# Patient Record
Sex: Male | Born: 1950 | Race: White | Hispanic: No | Marital: Married | Smoking: Former smoker
Health system: Southern US, Community
[De-identification: ages and names within clinical notes are randomized; demographics above are authoritative.]

---

## 2006-11-07 ENCOUNTER — Ambulatory Visit: Payer: Self-pay | Admitting: Gastroenterology

## 2017-09-18 DIAGNOSIS — L570 Actinic keratosis: Secondary | ICD-10-CM

## 2017-09-18 HISTORY — DX: Actinic keratosis: L57.0

## 2020-01-30 ENCOUNTER — Ambulatory Visit: Payer: Medicare PPO | Attending: Internal Medicine

## 2020-01-30 DIAGNOSIS — Z23 Encounter for immunization: Secondary | ICD-10-CM | POA: Insufficient documentation

## 2020-01-30 NOTE — Progress Notes (Signed)
   Covid-19 Vaccination Clinic  Name:  Mike Ellis    MRN: [id number] DOB: [birth year]  01/30/2020  [name] was observed post Covid-19 immunization for 15 minutes without incidence. He was provided with Vaccine Information Sheet and instruction to access the V-Safe system.   [name] was instructed to call 911 with any severe reactions post vaccine: Marland Kitchen Difficulty breathing  . Swelling of your face and throat  . A fast heartbeat  . A bad rash all over your body  . Dizziness and weakness    Immunizations Administered    Name Date Dose VIS Date Route   Pfizer COVID-19 Vaccine 01/30/2020  9:36 AM 0.3 mL 11/15/2019 Intramuscular   Manufacturer: [place]   Lot: [id number]   [place]: [id number]

## 2020-02-25 ENCOUNTER — Ambulatory Visit: Payer: Medicare PPO | Attending: Internal Medicine

## 2020-02-25 DIAGNOSIS — Z23 Encounter for immunization: Secondary | ICD-10-CM

## 2020-02-25 NOTE — Progress Notes (Signed)
   Covid-19 Vaccination Clinic  Name:  Mike Ellis    MRN: [id number] DOB: 21-Aug-1951  02/25/2020  Mr. Snapp was observed post Covid-19 immunization for 15 minutes without incident. He was provided with Vaccine Information Sheet and instruction to access the V-Safe system.   Mr. Polito was instructed to call 911 with any severe reactions post vaccine: Marland Kitchen Difficulty breathing  . Swelling of face and throat  . A fast heartbeat  . A bad rash all over body  . Dizziness and weakness   Immunizations Administered    Name Date Dose VIS Date Route   Pfizer COVID-19 Vaccine 02/25/2020  9:23 AM 0.3 mL 11/15/2019 Intramuscular   Manufacturer: [place]   Lot: [id number]   [place]: [id number]

## 2020-05-19 ENCOUNTER — Telehealth: Payer: Self-pay | Admitting: *Deleted

## 2020-05-19 NOTE — Telephone Encounter (Signed)
Attempted to contact for scheduling lung screening scan. Wife will have patient call back.

## 2020-05-20 ENCOUNTER — Telehealth: Payer: Self-pay | Admitting: *Deleted

## 2020-05-20 DIAGNOSIS — Z87891 Personal history of nicotine dependence: Secondary | ICD-10-CM

## 2020-05-20 NOTE — Telephone Encounter (Signed)
Received referral for initial lung cancer screening scan. Contacted patient and obtained smoking history,(former, quit 14 years ago, 37 pack year) as well as answering questions related to screening process. Patient denies signs of lung cancer such as weight loss or hemoptysis. Patient denies comorbidity that would prevent curative treatment if lung cancer were found. Patient is scheduled for CT scan on 05/29/20 at 830am.

## 2020-05-27 ENCOUNTER — Inpatient Hospital Stay: Payer: Medicare PPO | Attending: Oncology | Admitting: Oncology

## 2020-05-27 DIAGNOSIS — Z87891 Personal history of nicotine dependence: Secondary | ICD-10-CM | POA: Diagnosis not present

## 2020-05-27 NOTE — Progress Notes (Signed)
Virtual Visit via Video Note  I connected with [name] on 05/27/20 at  8:30 AM EDT by a video enabled telemedicine application and verified that I am speaking with the correct person using two identifiers.  Location: Patient: Home Provider: Clinic   I discussed the limitations of evaluation and management by telemedicine and the availability of in person appointments. The patient expressed understanding and agreed to proceed.  I discussed the assessment and treatment plan with the patient. The patient was provided an opportunity to ask questions and all were answered. The patient agreed with the plan and demonstrated an understanding of the instructions.   The patient was advised to call back or seek an in-person evaluation if the symptoms worsen or if the condition fails to improve as anticipated.   In accordance with CMS guidelines, patient has met eligibility criteria including age, absence of signs or symptoms of lung cancer.  Social History   Tobacco Use  . Smoking status: Not on file  Substance Use Topics  . Alcohol use: Not on file  . Drug use: Not on file      A shared decision-making session was conducted prior to the performance of CT scan. This includes one or more decision aids, includes benefits and harms of screening, follow-up diagnostic testing, over-diagnosis, false positive rate, and total radiation exposure.   Counseling on the importance of adherence to annual lung cancer LDCT screening, impact of co-morbidities, and ability or willingness to undergo diagnosis and treatment is imperative for compliance of the program.   Counseling on the importance of continued smoking cessation for former smokers; the importance of smoking cessation for current smokers, and information about tobacco cessation interventions have been given to patient including [place] and 1800 quit [place] programs.   Written order for lung cancer screening with LDCT has been given to  the patient and any and all questions have been answered to the best of my abilities.    Yearly follow up will be coordinated by Burgess Estelle, Thoracic Navigator.  I provided 15 minutes of face-to-face video visit time during this encounter, and > 50% was spent counseling as documented under my assessment & plan.   Jacquelin Hawking, NP

## 2020-05-29 ENCOUNTER — Other Ambulatory Visit: Payer: Self-pay

## 2020-05-29 ENCOUNTER — Ambulatory Visit
Admission: RE | Admit: 2020-05-29 | Discharge: 2020-05-29 | Disposition: A | Discharge status: Home / Self Care | Payer: Medicare PPO | Source: Ambulatory Visit | Attending: Oncology | Admitting: Oncology

## 2020-05-29 DIAGNOSIS — Z87891 Personal history of nicotine dependence: Secondary | ICD-10-CM | POA: Diagnosis not present

## 2020-06-01 ENCOUNTER — Telehealth: Payer: Self-pay | Admitting: *Deleted

## 2020-06-01 NOTE — Telephone Encounter (Signed)
°  Notified patient of LDCT lung cancer screening program results with recommendation for 12 month follow up imaging. Also notified of incidental findings noted below and is encouraged to discuss further with PCP who will receive a copy of this note and/or the CT report. Patient verbalizes understanding.   IMPRESSION: 1. Lung-RADS 2, benign appearance or behavior. Continue annual screening with low-dose chest CT without contrast in 12 months. 2. Emphysema (ICD10-J43.9). 3. Coronary artery atherosclerosis.

## 2020-10-16 ENCOUNTER — Other Ambulatory Visit: Payer: Self-pay | Admitting: Internal Medicine

## 2020-10-16 ENCOUNTER — Ambulatory Visit: Payer: Medicare PPO | Attending: Internal Medicine

## 2020-10-16 DIAGNOSIS — Z23 Encounter for immunization: Secondary | ICD-10-CM

## 2020-10-16 NOTE — Progress Notes (Signed)
   Covid-19 Vaccination Clinic  Name:  Mike Ellis    MRN: [id number] DOB: 1951/05/07  10/16/2020  [name] was observed post Covid-19 immunization for 15 minutes without incident. He was provided with Vaccine Information Sheet and instruction to access the V-Safe system.   [name] was instructed to call 911 with any severe reactions post vaccine: Marland Kitchen Difficulty breathing  . Swelling of face and throat  . A fast heartbeat  . A bad rash all over body  . Dizziness and weakness

## 2021-01-04 ENCOUNTER — Encounter: Payer: Medicare PPO | Admitting: Dermatology

## 2021-02-18 ENCOUNTER — Ambulatory Visit: Payer: Medicare PPO | Admitting: Dermatology

## 2021-05-21 ENCOUNTER — Ambulatory Visit: Payer: Medicare PPO | Attending: Internal Medicine

## 2021-05-21 ENCOUNTER — Other Ambulatory Visit: Payer: Self-pay

## 2021-05-21 DIAGNOSIS — Z23 Encounter for immunization: Secondary | ICD-10-CM

## 2021-05-21 MED ORDER — PFIZER-BIONT COVID-19 VAC-TRIS 30 MCG/0.3ML IM SUSP
INTRAMUSCULAR | 0 refills | Status: AC
  Filled 2021-05-21: qty 0.3, 1d supply, fill #0

## 2021-05-21 NOTE — Progress Notes (Signed)
   Covid-19 Vaccination Clinic  Name:  Mike Ellis    MRN: [id number] DOB: December 15, 1950  05/21/2021  Mike Ellis was observed post Covid-19 immunization for 15 minutes without incident. He was provided with Vaccine Information Sheet and instruction to access the V-Safe system.   Mike Ellis was instructed to call 911 with any severe reactions post vaccine: Difficulty breathing  Swelling of face and throat  A fast heartbeat  A bad rash all over body  Dizziness and weakness   Immunizations Administered     Name Date Dose VIS Date Route   PFIZER Comrnaty([place] TOP) Covid-19 Vaccine 05/21/2021  9:39 AM 0.3 mL 11/12/2020 Intramuscular   Manufacturer: [place]   Lot: [id number]   [place]: [address]-2      Lu Duffel, PharmD, MBA Clinical Acute Care Pharmacist

## 2021-06-02 ENCOUNTER — Ambulatory Visit: Payer: Medicare PPO | Admitting: Dermatology

## 2021-06-02 ENCOUNTER — Other Ambulatory Visit: Payer: Self-pay

## 2021-06-02 DIAGNOSIS — L578 Other skin changes due to chronic exposure to nonionizing radiation: Secondary | ICD-10-CM

## 2021-06-02 DIAGNOSIS — L570 Actinic keratosis: Secondary | ICD-10-CM | POA: Diagnosis not present

## 2021-06-02 DIAGNOSIS — Z1283 Encounter for screening for malignant neoplasm of skin: Secondary | ICD-10-CM

## 2021-06-02 DIAGNOSIS — D1800 Hemangioma unspecified site: Secondary | ICD-10-CM

## 2021-06-02 DIAGNOSIS — L820 Inflamed seborrheic keratosis: Secondary | ICD-10-CM | POA: Diagnosis not present

## 2021-06-02 DIAGNOSIS — L814 Other melanin hyperpigmentation: Secondary | ICD-10-CM | POA: Diagnosis not present

## 2021-06-02 DIAGNOSIS — L821 Other seborrheic keratosis: Secondary | ICD-10-CM | POA: Diagnosis not present

## 2021-06-02 DIAGNOSIS — C44622 Squamous cell carcinoma of skin of right upper limb, including shoulder: Secondary | ICD-10-CM | POA: Diagnosis not present

## 2021-06-02 DIAGNOSIS — C4492 Squamous cell carcinoma of skin, unspecified: Secondary | ICD-10-CM

## 2021-06-02 DIAGNOSIS — D492 Neoplasm of unspecified behavior of bone, soft tissue, and skin: Secondary | ICD-10-CM

## 2021-06-02 DIAGNOSIS — D229 Melanocytic nevi, unspecified: Secondary | ICD-10-CM

## 2021-06-02 HISTORY — DX: Squamous cell carcinoma of skin, unspecified: C44.92

## 2021-06-02 NOTE — Progress Notes (Signed)
Follow-Up Visit   Subjective  Mike Ellis is a 70 y.o. male who presents for the following: Annual Exam. Mole check, hx of Aks  The patient presents for Total-Body Skin Exam (TBSE) for skin cancer screening and mole check.   The following portions of the chart were reviewed this encounter and updated as appropriate:   Allergies  Meds  Problems  Med Hx  Surg Hx  Fam Hx      Review of Systems:  No other skin or systemic complaints except as noted in HPI or Assessment and Plan.  Objective  Well appearing patient in no apparent distress; mood and affect are within normal limits.  A full examination was performed including scalp, head, eyes, ears, nose, lips, neck, chest, axillae, abdomen, back, buttocks, bilateral upper extremities, bilateral lower extremities, hands, feet, fingers, toes, fingernails, and toenails. All findings within normal limits unless otherwise noted below.  right mid lateral forearm 0.8 cm keratotic papule   face, ears x 9 (9) Erythematous thin papules/macules with gritty scale.   trunk, arms, right knee, left popliteal  (23) (23) Erythematous keratotic or waxy stuck-on papule or plaque.    Assessment & Plan  Neoplasm of skin right mid lateral forearm  Epidermal / dermal shaving  Lesion diameter (cm):  0.8 Informed consent: discussed and consent obtained   Patient was prepped and draped in usual sterile fashion: area prepped with alcohol. Anesthesia: the lesion was anesthetized in a standard fashion   Anesthetic:  1% lidocaine w/ epinephrine 1-100,000 buffered w/ 8.4% NaHCO3 Instrument used: flexible razor blade   Hemostasis achieved with: pressure, aluminum chloride and electrodesiccation   Outcome: patient tolerated procedure well   Post-procedure details: wound care instructions given   Post-procedure details comment:  Ointment and small bandage applied  Destruction of lesion  Destruction method: electrodesiccation and curettage    Informed consent: discussed and consent obtained   Timeout:  patient name, date of birth, surgical site, and procedure verified Anesthesia: the lesion was anesthetized in a standard fashion   Anesthetic:  1% lidocaine w/ epinephrine 1-100,000 buffered w/ 8.4% NaHCO3 Curettage performed in three different directions: Yes   Electrodesiccation performed over the curetted area: Yes   Curettage cycles:  3 Lesion length (cm):  0.8 Lesion width (cm):  0.8 Margin per side (cm):  0.2 Final wound size (cm):  1.2 Hemostasis achieved with:  electrodesiccation Outcome: patient tolerated procedure well with no complications   Post-procedure details: sterile dressing applied and wound care instructions given   Dressing type: petrolatum    Specimen 1 - Surgical pathology Differential Diagnosis: R/O SCC  Check Margins: No  AK (actinic keratosis) (9) face, ears x 9  Actinic keratoses are precancerous spots that appear secondary to cumulative UV radiation exposure/sun exposure over time. They are chronic with expected duration over 1 year. A portion of actinic keratoses will progress to squamous cell carcinoma of the skin. It is not possible to reliably predict which spots will progress to skin cancer and so treatment is recommended to prevent development of skin cancer.  Recommend daily broad spectrum sunscreen SPF 30+ to sun-exposed areas, reapply every 2 hours as needed.  Recommend staying in the shade or wearing long sleeves, sun glasses (UVA+UVB protection) and wide brim hats (4-inch brim around the entire circumference of the hat). Call for new or changing lesions.   Destruction of lesion - face, ears x 9 Complexity: simple   Destruction method: cryotherapy   Informed consent: discussed and consent obtained  Timeout:  patient name, date of birth, surgical site, and procedure verified Lesion destroyed using liquid nitrogen: Yes   Region frozen until ice ball extended beyond lesion: Yes    Outcome: patient tolerated procedure well with no complications   Post-procedure details: wound care instructions given    Inflamed seborrheic keratosis trunk, arms, right knee, left popliteal  (23)  Destruction of lesion - trunk, arms, right knee, left popliteal  (23) Complexity: simple   Destruction method: cryotherapy   Informed consent: discussed and consent obtained   Timeout:  patient name, date of birth, surgical site, and procedure verified Lesion destroyed using liquid nitrogen: Yes   Region frozen until ice ball extended beyond lesion: Yes   Outcome: patient tolerated procedure well with no complications   Post-procedure details: wound care instructions given    Skin cancer screening  Lentigines - Scattered tan macules - Due to sun exposure - Benign-appering, observe - Recommend daily broad spectrum sunscreen SPF 30+ to sun-exposed areas, reapply every 2 hours as needed. - Call for any changes  Seborrheic Keratoses - Stuck-on, waxy, tan-brown papules and/or plaques  - Benign-appearing - Discussed benign etiology and prognosis. - Observe - Call for any changes  Melanocytic Nevi - Tan-brown and/or pink-flesh-colored symmetric macules and papules - Benign appearing on exam today - Observation - Call clinic for new or changing moles - Recommend daily use of broad spectrum spf 30+ sunscreen to sun-exposed areas.   Hemangiomas - Red papules - Discussed benign nature - Observe - Call for any changes  Actinic Damage - Chronic condition, secondary to cumulative UV/sun exposure - diffuse scaly erythematous macules with underlying dyspigmentation - Recommend daily broad spectrum sunscreen SPF 30+ to sun-exposed areas, reapply every 2 hours as needed.  - Staying in the shade or wearing long sleeves, sun glasses (UVA+UVB protection) and wide brim hats (4-inch brim around the entire circumference of the hat) are also recommended for sun protection.  - Call for new or  changing lesions.  Skin cancer screening performed today.   Return in about 1 year (around 06/02/2022) for TBSE.  IMarye Round, CMA, am acting as scribe for Sarina Ser, MD .  Documentation: I have reviewed the above documentation for accuracy and completeness, and I agree with the above.  Sarina Ser, MD

## 2021-06-02 NOTE — Patient Instructions (Signed)
Cryotherapy Aftercare  Wash gently with soap and water everyday.   Apply Vaseline and Band-Aid daily until healed.    Wound Care Instructions  Cleanse wound gently with soap and water once a day then pat dry with clean gauze. Apply a thing coat of Petrolatum (petroleum jelly, "Vaseline") over the wound (unless you have an allergy to this). We recommend that you use a new, sterile tube of Vaseline. Do not pick or remove scabs. Do not remove the yellow or white "healing tissue" from the base of the wound.  Cover the wound with fresh, clean, nonstick gauze and secure with paper tape. You may use Band-Aids in place of gauze and tape if the would is small enough, but would recommend trimming much of the tape off as there is often too much. Sometimes Band-Aids can irritate the skin.  You should call the office for your biopsy report after 1 week if you have not already been contacted.  If you experience any problems, such as abnormal amounts of bleeding, swelling, significant bruising, significant pain, or evidence of infection, please call the office immediately.  FOR ADULT SURGERY PATIENTS: If you need something for pain relief you may take 1 extra strength Tylenol (acetaminophen) AND 2 Ibuprofen (200mg each) together every 4 hours as needed for pain. (do not take these if you are allergic to them or if you have a reason you should not take them.) Typically, you may only need pain medication for 1 to 3 days.     If you have any questions or concerns for your doctor, please call our main line at [phone] and press option 4 to reach your doctor's medical assistant. If no one answers, please leave a voicemail as directed and we will return your call as soon as possible. Messages left after 4 pm will be answered the following business day.   You may also send us a message via MyChart. We typically respond to MyChart messages within 1-2 business days.  For prescription refills, please ask your  pharmacy to contact our office. Our fax number is [phone].  If you have an urgent issue when the clinic is closed that cannot wait until the next business day, you can page your doctor at the number below.    Please note that while we do our best to be available for urgent issues outside of office hours, we are not available 24/7.   If you have an urgent issue and are unable to reach us, you may choose to seek medical care at your doctor's office, retail clinic, urgent care center, or emergency room.  If you have a medical emergency, please immediately call 911 or go to the emergency department.  Pager Numbers  - Dr. Kowalski: [phone]  - Dr. Moye: [phone]  - Dr. Stewart: [phone]  In the event of inclement weather, please call our main line at [phone] for an update on the status of any delays or closures.  Dermatology Medication Tips: Please keep the boxes that topical medications come in in order to help keep track of the instructions about where and how to use these. Pharmacies typically print the medication instructions only on the boxes and not directly on the medication tubes.   If your medication is too expensive, please contact our office at [phone] option 4 or send us a message through MyChart.   We are unable to tell what your co-pay for medications will be in advance as this is different depending on your insurance coverage.   However, we may be able to find a substitute medication at lower cost or fill out paperwork to get insurance to cover a needed medication.   If a prior authorization is required to get your medication covered by your insurance company, please allow us 1-2 business days to complete this process.  Drug prices often vary depending on where the prescription is filled and some pharmacies may offer cheaper prices.  The website www.goodrx.com contains coupons for medications through different pharmacies. The prices here do not  account for what the cost may be with help from insurance (it may be cheaper with your insurance), but the website can give you the price if you did not use any insurance.  - You can print the associated coupon and take it with your prescription to the pharmacy.  - You may also stop by our office during regular business hours and pick up a GoodRx coupon card.  - If you need your prescription sent electronically to a different pharmacy, notify our office through [place] MyChart or by phone at [phone] option 4.  

## 2021-06-08 ENCOUNTER — Other Ambulatory Visit: Payer: Self-pay

## 2021-06-10 ENCOUNTER — Telehealth: Payer: Self-pay

## 2021-06-10 NOTE — Telephone Encounter (Signed)
-----   Message from Brendolyn Patty, MD sent at 06/09/2021  8:52 PM EDT ----- Skin , right mid lateral forearm WELL DIFFERENTIATED SQUAMOUS CELL CARCINOMA WITH SUPERFICIAL INFILTRATION, BASE INVOLVED  SCC skin cancer- already treated with EDC at time of biopsy  - please call pt

## 2021-06-10 NOTE — Telephone Encounter (Signed)
Advised patient of results/hd  

## 2021-06-12 ENCOUNTER — Encounter: Payer: Self-pay | Admitting: Dermatology

## 2021-06-14 ENCOUNTER — Ambulatory Visit: Payer: Medicare PPO | Admitting: Dermatology

## 2021-12-09 ENCOUNTER — Ambulatory Visit: Payer: Medicare PPO | Admitting: Dermatology

## 2021-12-09 ENCOUNTER — Other Ambulatory Visit: Payer: Self-pay

## 2021-12-09 DIAGNOSIS — L578 Other skin changes due to chronic exposure to nonionizing radiation: Secondary | ICD-10-CM | POA: Diagnosis not present

## 2021-12-09 DIAGNOSIS — L821 Other seborrheic keratosis: Secondary | ICD-10-CM

## 2021-12-09 DIAGNOSIS — L820 Inflamed seborrheic keratosis: Secondary | ICD-10-CM

## 2021-12-09 NOTE — Patient Instructions (Addendum)
Seborrheic Keratosis ? ?What causes seborrheic keratoses? ?Seborrheic keratoses are harmless, common skin growths that first appear during adult life.  As time goes by, more growths appear.  Some people may develop a large number of them.  Seborrheic keratoses appear on both covered and uncovered body parts.  They are not caused by sunlight.  The tendency to develop seborrheic keratoses can be inherited.  They vary in color from skin-colored to gray, brown, or even black.  They can be either smooth or have a rough, warty surface.   ?Seborrheic keratoses are superficial and look as if they were stuck on the skin.  Under the microscope this type of keratosis looks like layers upon layers of skin.  That is why at times the top layer may seem to fall off, but the rest of the growth remains and re-grows.   ? ?Treatment ?Seborrheic keratoses do not need to be treated, but can easily be removed in the office.  Seborrheic keratoses often cause symptoms when they rub on clothing or jewelry.  Lesions can be in the way of shaving.  If they become inflamed, they can cause itching, soreness, or burning.  Removal of a seborrheic keratosis can be accomplished by freezing, burning, or surgery. ?If any spot bleeds, scabs, or grows rapidly, please return to have it checked, as these can be an indication of a skin cancer. ? ? ?Cryotherapy Aftercare ? ?Wash gently with soap and water everyday.   ?Apply Vaseline and Band-Aid daily until healed.  ? ? ?If You Need Anything After Your Visit ? ?If you have any questions or concerns for your doctor, please call our main line at [phone] and press option 4 to reach your doctor's medical assistant. If no one answers, please leave a voicemail as directed and we will return your call as soon as possible. Messages left after 4 pm will be answered the following business day.  ? ?You may also send us a message via MyChart. We typically respond to MyChart messages within 1-2 business  days. ? ?For prescription refills, please ask your pharmacy to contact our office. Our fax number is [phone]. ? ?If you have an urgent issue when the clinic is closed that cannot wait until the next business day, you can page your doctor at the number below.   ? ?Please note that while we do our best to be available for urgent issues outside of office hours, we are not available 24/7.  ? ?If you have an urgent issue and are unable to reach us, you may choose to seek medical care at your doctor's office, retail clinic, urgent care center, or emergency room. ? ?If you have a medical emergency, please immediately call 911 or go to the emergency department. ? ?Pager Numbers ? ?- Dr. Kowalski: [phone] ? ?- Dr. Moye: [phone] ? ?- Dr. Stewart: [phone] ? ?In the event of inclement weather, please call our main line at [phone] for an update on the status of any delays or closures. ? ?Dermatology Medication Tips: ?Please keep the boxes that topical medications come in in order to help keep track of the instructions about where and how to use these. Pharmacies typically print the medication instructions only on the boxes and not directly on the medication tubes.  ? ?If your medication is too expensive, please contact our office at [phone] option 4 or send us a message through MyChart.  ? ?We are unable to tell what your co-pay for medications will be in advance   as this is different depending on your insurance coverage. However, we may be able to find a substitute medication at lower cost or fill out paperwork to get insurance to cover a needed medication.  ? ?If a prior authorization is required to get your medication covered by your insurance company, please allow us 1-2 business days to complete this process. ? ?Drug prices often vary depending on where the prescription is filled and some pharmacies may offer cheaper prices. ? ?The website www.goodrx.com contains coupons for medications through  different pharmacies. The prices here do not account for what the cost may be with help from insurance (it may be cheaper with your insurance), but the website can give you the price if you did not use any insurance.  ?- You can print the associated coupon and take it with your prescription to the pharmacy.  ?- You may also stop by our office during regular business hours and pick up a GoodRx coupon card.  ?- If you need your prescription sent electronically to a different pharmacy, notify our office through [facility] MyChart or by phone at [phone] option 4. ? ? ? ? ?Si Usted Necesita Algo Despu?s de Su Visita ? ?Tambi?n puede enviarnos un mensaje a trav?s de MyChart. Por lo general respondemos a los mensajes de MyChart en el transcurso de 1 a 2 d?as h?biles. ? ?Para renovar recetas, por favor pida a su farmacia que se ponga en contacto con nuestra oficina. Nuestro n?mero de fax es el [phone]. ? ?Si tiene un asunto urgente cuando la cl?nica est? cerrada y que no puede esperar hasta el siguiente d?a h?bil, puede llamar/localizar a su doctor(a) al n?mero que aparece a continuaci?n.  ? ?Por favor, tenga en cuenta que aunque hacemos todo lo posible para estar disponibles para asuntos urgentes fuera del horario de oficina, no estamos disponibles las 24 horas del d?a, los 7 d?as de la semana.  ? ?Si tiene un problema urgente y no puede comunicarse con nosotros, puede optar por buscar atenci?n m?dica  en el consultorio de su doctor(a), en una cl?nica privada, en un centro de atenci?n urgente o en una sala de emergencias. ? ?Si tiene una emergencia m?dica, por favor llame inmediatamente al 911 o vaya a la sala de emergencias. ? ?N?meros de b?per ? ?- Dr. Kowalski: [phone] ? ?- Dra. Moye: [phone] ? ?- Dra. Stewart: [phone] ? ?En caso de inclemencias del tiempo, por favor llame a nuestra l?nea principal al [phone] para una actualizaci?n sobre el estado de cualquier retraso o cierre. ? ?Consejos  para la medicaci?n en dermatolog?a: ?Por favor, guarde las cajas en las que vienen los medicamentos de uso t?pico para ayudarle a seguir las instrucciones sobre d?nde y c?mo usarlos. Las farmacias generalmente imprimen las instrucciones del medicamento s?lo en las cajas y no directamente en los tubos del medicamento.  ? ?Si su medicamento es muy caro, por favor, p?ngase en contacto con nuestra oficina llamando al [phone] y presione la opci?n 4 o env?enos un mensaje a trav?s de MyChart.  ? ?No podemos decirle cu?l ser? su copago por los medicamentos por adelantado ya que esto es diferente dependiendo de la cobertura de su seguro. Sin embargo, es posible que podamos encontrar un medicamento sustituto a menor costo o llenar un formulario para que el seguro cubra el medicamento que se considera necesario.  ? ?Si se requiere una autorizaci?n previa para que su compa??a de seguros cubra su medicamento, por favor perm?tanos de 1 a 2 d?as   h?biles para completar este proceso. ? ?Los precios de los medicamentos var?an con frecuencia dependiendo del lugar de d?nde se surte la receta y alguna farmacias pueden ofrecer precios m?s baratos. ? ?El sitio web www.goodrx.com tiene cupones para medicamentos de diferentes farmacias. Los precios aqu? no tienen en cuenta lo que podr?a costar con la ayuda del seguro (puede ser m?s barato con su seguro), pero el sitio web puede darle el precio si no utiliz? ning?n seguro.  ?- Puede imprimir el cup?n correspondiente y llevarlo con su receta a la farmacia.  ?- Tambi?n puede pasar por nuestra oficina durante el horario de atenci?n regular y recoger una tarjeta de cupones de GoodRx.  ?- Si necesita que su receta se env?e electr?nicamente a una farmacia diferente, informe a nuestra oficina a trav?s de MyChart de [facility] o por tel?fono llamando al [phone] y presione la opci?n 4. ? ?

## 2021-12-09 NOTE — Progress Notes (Signed)
° °  Follow-Up Visit   Subjective  Mike Ellis is a 71 y.o. male who presents for the following: Follow-up (Patient here today concerning a place under right eyelid about 2 months ago, he noticed that it has grown. Patient states that he noticed another spot under his left eyelid. Patient also reports a itchy scaly spot at chest he would like checked. ). The patient has spots, moles and lesions to be evaluated, some may be new or changing and the patient has concerns that these could be cancer.  The following portions of the chart were reviewed this encounter and updated as appropriate:  Allergies   Meds   Problems   Med Hx   Surg Hx   Fam Hx      Review of Systems: No other skin or systemic complaints except as noted in HPI or Assessment and Plan.  Objective  Well appearing patient in no apparent distress; mood and affect are within normal limits.  A focused examination was performed including left and right lower eyelid, chest. Relevant physical exam findings are noted in the Assessment and Plan.  left lower lateral eyelid x 1, right lower lateral eyelid x 1, right medial pectoral x 1 (3) Erythematous stuck-on, waxy papule or plaque   Assessment & Plan  Inflamed seborrheic keratosis (3) left lower lateral eyelid x 1, right lower lateral eyelid x 1, right medial pectoral x 1  Destruction of lesion - left lower lateral eyelid x 1, right lower lateral eyelid x 1, right medial pectoral x 1 Complexity: simple   Destruction method: cryotherapy   Informed consent: discussed and consent obtained   Timeout:  patient name, date of birth, surgical site, and procedure verified Lesion destroyed using liquid nitrogen: Yes   Region frozen until ice ball extended beyond lesion: Yes   Outcome: patient tolerated procedure well with no complications   Post-procedure details: wound care instructions given   Additional details:  Prior to procedure, discussed risks of blister formation, small wound, skin  dyspigmentation, or rare scar following cryotherapy. Recommend Vaseline ointment to treated areas while healing.   Actinic Damage - chronic, secondary to cumulative UV radiation exposure/sun exposure over time - diffuse scaly erythematous macules with underlying dyspigmentation - Recommend daily broad spectrum sunscreen SPF 30+ to sun-exposed areas, reapply every 2 hours as needed.  - Recommend staying in the shade or wearing long sleeves, sun glasses (UVA+UVB protection) and wide brim hats (4-inch brim around the entire circumference of the hat). - Call for new or changing lesions.  Seborrheic Keratoses - Stuck-on, waxy, tan-brown papules and/or plaques  - Benign-appearing - Discussed benign etiology and prognosis. - Observe - Call for any changes  Return for keep follow up as scheduled in july .  IRuthell Rummage, CMA, am acting as scribe for Sarina Ser, MD. Documentation: I have reviewed the above documentation for accuracy and completeness, and I agree with the above.  Sarina Ser, MD

## 2021-12-10 ENCOUNTER — Encounter: Payer: Self-pay | Admitting: Dermatology

## 2021-12-16 ENCOUNTER — Ambulatory Visit: Payer: Medicare PPO | Attending: Internal Medicine

## 2021-12-16 ENCOUNTER — Other Ambulatory Visit: Payer: Self-pay

## 2021-12-16 DIAGNOSIS — Z23 Encounter for immunization: Secondary | ICD-10-CM

## 2021-12-16 MED ORDER — PFIZER COVID-19 VAC BIVALENT 30 MCG/0.3ML IM SUSP
INTRAMUSCULAR | 0 refills | Status: AC
  Filled 2021-12-16: qty 0.3, 1d supply, fill #0

## 2021-12-16 NOTE — Progress Notes (Signed)
° °  Covid-19 Vaccination Clinic  Name:  Mike Ellis    MRN: [id number] DOB: 1951/07/26  12/16/2021  [name] was observed post Covid-19 immunization for 15 minutes without incident. He was provided with Vaccine Information Sheet and instruction to access the V-Safe system.   [name] was instructed to call 911 with any severe reactions post vaccine: Difficulty breathing  Swelling of face and throat  A fast heartbeat  A bad rash all over body  Dizziness and weakness   Immunizations Administered     Name Date Dose VIS Date Route   Pfizer Covid-19 Vaccine Bivalent Booster 12/16/2021  9:41 AM 0.3 mL 08/04/2021 Intramuscular   Manufacturer: [employer]   Lot: [id number]   [place]: [phone]

## 2022-05-19 ENCOUNTER — Other Ambulatory Visit: Payer: Self-pay | Admitting: Family Medicine

## 2022-05-19 DIAGNOSIS — R030 Elevated blood-pressure reading, without diagnosis of hypertension: Secondary | ICD-10-CM

## 2022-05-19 DIAGNOSIS — I2510 Atherosclerotic heart disease of native coronary artery without angina pectoris: Secondary | ICD-10-CM

## 2022-05-19 DIAGNOSIS — Z87891 Personal history of nicotine dependence: Secondary | ICD-10-CM

## 2022-05-24 ENCOUNTER — Ambulatory Visit
Admission: RE | Admit: 2022-05-24 | Discharge: 2022-05-24 | Disposition: A | Discharge status: Home / Self Care | Payer: Medicare PPO | Source: Ambulatory Visit | Attending: Family Medicine | Admitting: Family Medicine

## 2022-05-24 DIAGNOSIS — Z136 Encounter for screening for cardiovascular disorders: Secondary | ICD-10-CM | POA: Insufficient documentation

## 2022-05-24 DIAGNOSIS — Z87891 Personal history of nicotine dependence: Secondary | ICD-10-CM | POA: Diagnosis present

## 2022-05-24 DIAGNOSIS — I2510 Atherosclerotic heart disease of native coronary artery without angina pectoris: Secondary | ICD-10-CM | POA: Diagnosis present

## 2022-05-24 DIAGNOSIS — R030 Elevated blood-pressure reading, without diagnosis of hypertension: Secondary | ICD-10-CM | POA: Insufficient documentation

## 2022-06-08 ENCOUNTER — Encounter: Payer: Medicare PPO | Admitting: Dermatology

## 2022-06-13 ENCOUNTER — Ambulatory Visit: Payer: Medicare PPO | Admitting: Dermatology

## 2022-06-13 DIAGNOSIS — L578 Other skin changes due to chronic exposure to nonionizing radiation: Secondary | ICD-10-CM

## 2022-06-13 DIAGNOSIS — Z85828 Personal history of other malignant neoplasm of skin: Secondary | ICD-10-CM | POA: Diagnosis not present

## 2022-06-13 DIAGNOSIS — L820 Inflamed seborrheic keratosis: Secondary | ICD-10-CM

## 2022-06-13 DIAGNOSIS — L814 Other melanin hyperpigmentation: Secondary | ICD-10-CM

## 2022-06-13 DIAGNOSIS — D1800 Hemangioma unspecified site: Secondary | ICD-10-CM | POA: Diagnosis not present

## 2022-06-13 DIAGNOSIS — L570 Actinic keratosis: Secondary | ICD-10-CM

## 2022-06-13 DIAGNOSIS — D229 Melanocytic nevi, unspecified: Secondary | ICD-10-CM | POA: Diagnosis not present

## 2022-06-13 DIAGNOSIS — D692 Other nonthrombocytopenic purpura: Secondary | ICD-10-CM

## 2022-06-13 DIAGNOSIS — Z1283 Encounter for screening for malignant neoplasm of skin: Secondary | ICD-10-CM

## 2022-06-13 DIAGNOSIS — L821 Other seborrheic keratosis: Secondary | ICD-10-CM

## 2022-06-13 NOTE — Progress Notes (Signed)
Follow-Up Visit   Subjective  Mike Ellis is a 71 y.o. male who presents for the following: UBSE (Hx AK's, SCC, ISK's ). The patient presents for Upper Body Skin Exam (UBSE) for skin cancer screening and mole check.  The patient has spots, moles and lesions to be evaluated, some may be new or changing.  The following portions of the chart were reviewed this encounter and updated as appropriate:   Allergies  Meds  Problems  Med Hx  Surg Hx  Fam Hx     Review of Systems:  No other skin or systemic complaints except as noted in HPI or Assessment and Plan.  Objective  Well appearing patient in no apparent distress; mood and affect are within normal limits.  All skin waist up examined.  L neck x 1, back x 10, arms and hands x 8 (19) Erythematous stuck-on, waxy papule or plaque  Face x 5 (5) Erythematous thin papules/macules with gritty scale.    Assessment & Plan  Inflamed seborrheic keratosis (19) L neck x 1, back x 10, arms and hands x 8  Destruction of lesion - L neck x 1, back x 10, arms and hands x 8 Complexity: simple   Destruction method: cryotherapy   Informed consent: discussed and consent obtained   Timeout:  patient name, date of birth, surgical site, and procedure verified Lesion destroyed using liquid nitrogen: Yes   Region frozen until ice ball extended beyond lesion: Yes   Outcome: patient tolerated procedure well with no complications   Post-procedure details: wound care instructions given    AK (actinic keratosis) (5) Face x 5  Destruction of lesion - Face x 5 Complexity: simple   Destruction method: cryotherapy   Informed consent: discussed and consent obtained   Timeout:  patient name, date of birth, surgical site, and procedure verified Lesion destroyed using liquid nitrogen: Yes   Region frozen until ice ball extended beyond lesion: Yes   Outcome: patient tolerated procedure well with no complications   Post-procedure details: wound care  instructions given    Skin cancer screening  Lentigines - Scattered tan macules - Due to sun exposure - Benign-appearing, observe - Recommend daily broad spectrum sunscreen SPF 30+ to sun-exposed areas, reapply every 2 hours as needed. - Call for any changes  Seborrheic Keratoses - Stuck-on, waxy, tan-brown papules and/or plaques  - Benign-appearing - Discussed benign etiology and prognosis. - Observe - Call for any changes  Melanocytic Nevi - Tan-brown and/or pink-flesh-colored symmetric macules and papules - Benign appearing on exam today - Observation - Call clinic for new or changing moles - Recommend daily use of broad spectrum spf 30+ sunscreen to sun-exposed areas.   Hemangiomas - Red papules - Discussed benign nature - Observe - Call for any changes  Actinic Damage - Chronic condition, secondary to cumulative UV/sun exposure - diffuse scaly erythematous macules with underlying dyspigmentation - Recommend daily broad spectrum sunscreen SPF 30+ to sun-exposed areas, reapply every 2 hours as needed.  - Staying in the shade or wearing long sleeves, sun glasses (UVA+UVB protection) and wide brim hats (4-inch brim around the entire circumference of the hat) are also recommended for sun protection.  - Call for new or changing lesions.  History of Squamous Cell Carcinoma of the Skin - No evidence of recurrence today - No lymphadenopathy - Recommend regular full body skin exams - Recommend daily broad spectrum sunscreen SPF 30+ to sun-exposed areas, reapply every 2 hours as needed.  - Call  if any new or changing lesions are noted between office visits  Purpura - Chronic; persistent and recurrent.  Treatable, but not curable. - Violaceous macules and patches - Benign - Related to trauma, age, sun damage and/or use of blood thinners, chronic use of topical and/or oral steroids - Observe - Can use OTC arnica containing moisturizer such as Dermend Bruise Formula if  desired - Call for worsening or other concerns  Skin cancer screening performed today.  Return in about 1 year (around 06/14/2023) for UBSE.  Luther Redo, CMA, am acting as scribe for Sarina Ser, MD . Documentation: I have reviewed the above documentation for accuracy and completeness, and I agree with the above.  Sarina Ser, MD

## 2022-06-13 NOTE — Patient Instructions (Signed)
Due to recent changes in healthcare laws, you may see results of your pathology and/or laboratory studies on MyChart before the doctors have had a chance to review them. We understand that in some cases there may be results that are confusing or concerning to you. Please understand that not all results are received at the same time and often the doctors may need to interpret multiple results in order to provide you with the best plan of care or course of treatment. Therefore, we ask that you please give us 2 business days to thoroughly review all your results before contacting the office for clarification. Should we see a critical lab result, you will be contacted sooner.   If You Need Anything After Your Visit  If you have any questions or concerns for your doctor, please call our main line at [phone] and press option 4 to reach your doctor's medical assistant. If no one answers, please leave a voicemail as directed and we will return your call as soon as possible. Messages left after 4 pm will be answered the following business day.   You may also send us a message via MyChart. We typically respond to MyChart messages within 1-2 business days.  For prescription refills, please ask your pharmacy to contact our office. Our fax number is [phone].  If you have an urgent issue when the clinic is closed that cannot wait until the next business day, you can page your doctor at the number below.    Please note that while we do our best to be available for urgent issues outside of office hours, we are not available 24/7.   If you have an urgent issue and are unable to reach us, you may choose to seek medical care at your doctor's office, retail clinic, urgent care center, or emergency room.  If you have a medical emergency, please immediately call 911 or go to the emergency department.  Pager Numbers  - Dr. Kowalski: [phone]  - Dr. Moye: [phone]  - Dr. Stewart:  [phone]  In the event of inclement weather, please call our main line at [phone] for an update on the status of any delays or closures.  Dermatology Medication Tips: Please keep the boxes that topical medications come in in order to help keep track of the instructions about where and how to use these. Pharmacies typically print the medication instructions only on the boxes and not directly on the medication tubes.   If your medication is too expensive, please contact our office at [phone] option 4 or send us a message through MyChart.   We are unable to tell what your co-pay for medications will be in advance as this is different depending on your insurance coverage. However, we may be able to find a substitute medication at lower cost or fill out paperwork to get insurance to cover a needed medication.   If a prior authorization is required to get your medication covered by your insurance company, please allow us 1-2 business days to complete this process.  Drug prices often vary depending on where the prescription is filled and some pharmacies may offer cheaper prices.  The website www.goodrx.com contains coupons for medications through different pharmacies. The prices here do not account for what the cost may be with help from insurance (it may be cheaper with your insurance), but the website can give you the price if you did not use any insurance.  - You can print the associated coupon and take it with   your prescription to the pharmacy.  - You may also stop by our office during regular business hours and pick up a GoodRx coupon card.  - If you need your prescription sent electronically to a different pharmacy, notify our office through [facility] MyChart or by phone at [phone] option 4.     Si Usted Necesita Algo Despus de Su Visita  Tambin puede enviarnos un mensaje a travs de MyChart. Por lo general respondemos a los mensajes de MyChart en el transcurso de 1 a 2  das hbiles.  Para renovar recetas, por favor pida a su farmacia que se ponga en contacto con nuestra oficina. Nuestro nmero de fax es el [phone].  Si tiene un asunto urgente cuando la clnica est cerrada y que no puede esperar hasta el siguiente da hbil, puede llamar/localizar a su doctor(a) al nmero que aparece a continuacin.   Por favor, tenga en cuenta que aunque hacemos todo lo posible para estar disponibles para asuntos urgentes fuera del horario de oficina, no estamos disponibles las 24 horas del da, los 7 das de la semana.   Si tiene un problema urgente y no puede comunicarse con nosotros, puede optar por buscar atencin mdica  en el consultorio de su doctor(a), en una clnica privada, en un centro de atencin urgente o en una sala de emergencias.  Si tiene una emergencia mdica, por favor llame inmediatamente al 911 o vaya a la sala de emergencias.  Nmeros de bper  - Dr. Kowalski: [phone]  - Dra. Moye: [phone]  - Dra. Stewart: [phone]  En caso de inclemencias del tiempo, por favor llame a nuestra lnea principal al [phone] para una actualizacin sobre el estado de cualquier retraso o cierre.  Consejos para la medicacin en dermatologa: Por favor, guarde las cajas en las que vienen los medicamentos de uso tpico para ayudarle a seguir las instrucciones sobre dnde y cmo usarlos. Las farmacias generalmente imprimen las instrucciones del medicamento slo en las cajas y no directamente en los tubos del medicamento.   Si su medicamento es muy caro, por favor, pngase en contacto con nuestra oficina llamando al [phone] y presione la opcin 4 o envenos un mensaje a travs de MyChart.   No podemos decirle cul ser su copago por los medicamentos por adelantado ya que esto es diferente dependiendo de la cobertura de su seguro. Sin embargo, es posible que podamos encontrar un medicamento sustituto a menor costo o llenar un formulario para que el  seguro cubra el medicamento que se considera necesario.   Si se requiere una autorizacin previa para que su compaa de seguros cubra su medicamento, por favor permtanos de 1 a 2 das hbiles para completar este proceso.  Los precios de los medicamentos varan con frecuencia dependiendo del lugar de dnde se surte la receta y alguna farmacias pueden ofrecer precios ms baratos.  El sitio web www.goodrx.com tiene cupones para medicamentos de diferentes farmacias. Los precios aqu no tienen en cuenta lo que podra costar con la ayuda del seguro (puede ser ms barato con su seguro), pero el sitio web puede darle el precio si no utiliz ningn seguro.  - Puede imprimir el cupn correspondiente y llevarlo con su receta a la farmacia.  - Tambin puede pasar por nuestra oficina durante el horario de atencin regular y recoger una tarjeta de cupones de GoodRx.  - Si necesita que su receta se enve electrnicamente a una farmacia diferente, informe a nuestra oficina a travs de MyChart de    o por telfono llamando al [phone] y presione la opcin 4.  

## 2022-06-14 ENCOUNTER — Encounter: Payer: Medicare PPO | Admitting: Dermatology

## 2022-06-21 ENCOUNTER — Encounter: Payer: Self-pay | Admitting: Dermatology

## 2022-12-12 ENCOUNTER — Encounter: Payer: Self-pay | Admitting: *Deleted

## 2022-12-13 ENCOUNTER — Ambulatory Visit
Admission: RE | Admit: 2022-12-13 | Discharge: 2022-12-13 | Disposition: A | Discharge status: Home / Self Care | Payer: Medicare PPO | Attending: Gastroenterology | Admitting: Gastroenterology

## 2022-12-13 ENCOUNTER — Ambulatory Visit: Payer: Medicare PPO | Admitting: Anesthesiology

## 2022-12-13 ENCOUNTER — Encounter: Payer: Self-pay | Admitting: *Deleted

## 2022-12-13 ENCOUNTER — Encounter
Admission: RE | Disposition: A | Discharge status: Home / Self Care | Payer: Self-pay | Source: Home / Self Care | Attending: Gastroenterology

## 2022-12-13 DIAGNOSIS — Z87891 Personal history of nicotine dependence: Secondary | ICD-10-CM | POA: Diagnosis not present

## 2022-12-13 DIAGNOSIS — Z1211 Encounter for screening for malignant neoplasm of colon: Secondary | ICD-10-CM | POA: Diagnosis present

## 2022-12-13 DIAGNOSIS — E785 Hyperlipidemia, unspecified: Secondary | ICD-10-CM | POA: Insufficient documentation

## 2022-12-13 DIAGNOSIS — Z85828 Personal history of other malignant neoplasm of skin: Secondary | ICD-10-CM | POA: Insufficient documentation

## 2022-12-13 DIAGNOSIS — K5730 Diverticulosis of large intestine without perforation or abscess without bleeding: Secondary | ICD-10-CM | POA: Diagnosis not present

## 2022-12-13 DIAGNOSIS — L570 Actinic keratosis: Secondary | ICD-10-CM | POA: Diagnosis not present

## 2022-12-13 DIAGNOSIS — D122 Benign neoplasm of ascending colon: Secondary | ICD-10-CM | POA: Insufficient documentation

## 2022-12-13 DIAGNOSIS — Z86010 Personal history of colonic polyps: Secondary | ICD-10-CM | POA: Insufficient documentation

## 2022-12-13 HISTORY — PX: COLONOSCOPY WITH PROPOFOL: SHX5780

## 2022-12-13 SURGERY — COLONOSCOPY WITH PROPOFOL
Anesthesia: General

## 2022-12-13 MED ORDER — LIDOCAINE HCL (CARDIAC) PF 100 MG/5ML IV SOSY
PREFILLED_SYRINGE | INTRAVENOUS | Status: DC | PRN
  Administered 2022-12-13: 40 mg via INTRAVENOUS

## 2022-12-13 MED ORDER — SODIUM CHLORIDE 0.9 % IV SOLN: INTRAVENOUS | Status: DC

## 2022-12-13 MED ORDER — PROPOFOL 10 MG/ML IV BOLUS
INTRAVENOUS | Status: DC | PRN
  Administered 2022-12-13: 40 mg via INTRAVENOUS
  Administered 2022-12-13 (×2): 50 mg via INTRAVENOUS
  Administered 2022-12-13: 40 mg via INTRAVENOUS
  Administered 2022-12-13: 30 mg via INTRAVENOUS
  Administered 2022-12-13: 40 mg via INTRAVENOUS
  Administered 2022-12-13: 30 mg via INTRAVENOUS
  Administered 2022-12-13: 100 mg via INTRAVENOUS

## 2022-12-13 NOTE — Anesthesia Preprocedure Evaluation (Signed)
Anesthesia Evaluation  Patient identified by MRN, date of birth, ID band Patient awake    Reviewed: Allergy & Precautions, NPO status , Patient's Chart, lab work & pertinent test results  History of Anesthesia Complications Negative for: history of anesthetic complications  Airway Mallampati: III  TM Distance: >3 FB Neck ROM: full    Dental  (+) Chipped   Pulmonary neg shortness of breath, former smoker   Pulmonary exam normal        Cardiovascular Exercise Tolerance: Good (-) angina (-) Past MI Normal cardiovascular exam     Neuro/Psych negative neurological ROS  negative psych ROS   GI/Hepatic negative GI ROS, Neg liver ROS,neg GERD  ,,  Endo/Other  negative endocrine ROS    Renal/GU negative Renal ROS  negative genitourinary   Musculoskeletal   Abdominal   Peds  Hematology negative hematology ROS (+)   Anesthesia Other Findings Past Medical History: 09/18/2017: Actinic keratosis     Comment:  R nasal tip - bx proven  06/02/2021: Squamous cell carcinoma of skin     Comment:  right mid lat forearm  EDC  History reviewed. No pertinent surgical history.  BMI    Body Mass Index: 23.43 kg/m      Reproductive/Obstetrics negative OB ROS                             Anesthesia Physical Anesthesia Plan  ASA: 2  Anesthesia Plan: General   Post-op Pain Management:    Induction: Intravenous  PONV Risk Score and Plan: Propofol infusion and TIVA  Airway Management Planned: Natural Airway and Nasal Cannula  Additional Equipment:   Intra-op Plan:   Post-operative Plan:   Informed Consent: I have reviewed the patients History and Physical, chart, labs and discussed the procedure including the risks, benefits and alternatives for the proposed anesthesia with the patient or authorized representative who has indicated his/her understanding and acceptance.     Dental Advisory  Given  Plan Discussed with: Anesthesiologist, CRNA and Surgeon  Anesthesia Plan Comments: (Patient consented for risks of anesthesia including but not limited to:  - adverse reactions to medications - risk of airway placement if required - damage to eyes, teeth, lips or other oral mucosa - nerve damage due to positioning  - sore throat or hoarseness - Damage to heart, brain, nerves, lungs, other parts of body or loss of life  Patient voiced understanding.)       Anesthesia Quick Evaluation

## 2022-12-13 NOTE — Op Note (Signed)
[facility] Patient Name: Mike Ellis Procedure Date: 12/13/2022 8:36 AM MRN: [id number] Account #: [id number] Date of Birth: 11-09-1951 Admit Type: Outpatient Age: 72 Room: [facility] ENDO ROOM 1 Gender: Male Note Status: Finalized Instrument Name: Jasper Riling [id number] Procedure:             Colonoscopy Indications:           Surveillance: Personal history of adenomatous polyps                         on last colonoscopy 5 years ago Providers:             Andrey Farmer MD, MD Medicines:             Monitored Anesthesia Care Complications:         No immediate complications. Estimated blood loss:                         Minimal. Procedure:             Pre-Anesthesia Assessment:                        - Prior to the procedure, a History and Physical was                         performed, and patient medications and allergies were                         reviewed. The patient is competent. The risks and                         benefits of the procedure and the sedation options and                         risks were discussed with the patient. All questions                         were answered and informed consent was obtained.                         Patient identification and proposed procedure were                         verified by the physician, the nurse, the                         anesthesiologist, the anesthetist and the technician                         in the endoscopy suite. Mental Status Examination:                         alert and oriented. Airway Examination: normal                         oropharyngeal airway and neck mobility. Respiratory                         Examination: clear to auscultation. CV Examination:  normal. Prophylactic Antibiotics: The patient does not                         require prophylactic antibiotics. Prior                         Anticoagulants: The patient has taken no anticoagulant                          or antiplatelet agents. ASA Grade Assessment: II - A                         patient with mild systemic disease. After reviewing                         the risks and benefits, the patient was deemed in                         satisfactory condition to undergo the procedure. The                         anesthesia plan was to use monitored anesthesia care                         (MAC). Immediately prior to administration of                         medications, the patient was re-assessed for adequacy                         to receive sedatives. The heart rate, respiratory                         rate, oxygen saturations, blood pressure, adequacy of                         pulmonary ventilation, and response to care were                         monitored throughout the procedure. The physical                         status of the patient was re-assessed after the                         procedure.                        After obtaining informed consent, the colonoscope was                         passed under direct vision. Throughout the procedure,                         the patient's blood pressure, pulse, and oxygen                         saturations were monitored continuously. The  Colonoscope was introduced through the anus and                         advanced to the the cecum, identified by appendiceal                         orifice and ileocecal valve. The colonoscopy was                         performed without difficulty. The patient tolerated                         the procedure well. The quality of the bowel                         preparation was good. The ileocecal valve, appendiceal                         orifice, and rectum were photographed. Findings:      The perianal and digital rectal examinations were normal.      A 3 mm polyp was found in the ascending colon. The polyp was sessile.       The polyp was removed with a  cold snare. Resection and retrieval were       complete. Estimated blood loss was minimal.      Multiple small-mouthed diverticula were found in the sigmoid colon.      The exam was otherwise without abnormality on direct and retroflexion       views. Impression:            - One 3 mm polyp in the ascending colon, removed with                         a cold snare. Resected and retrieved.                        - Diverticulosis in the sigmoid colon.                        - The examination was otherwise normal on direct and                         retroflexion views. Recommendation:        - Discharge patient to home.                        - Resume previous diet.                        - Continue present medications.                        - Await pathology results.                        - Repeat colonoscopy is not recommended due to current                         age (72 years or older) for surveillance.                        -  Return to referring physician as previously                         scheduled. Procedure Code(s):     --- Professional ---                        [phone], Colonoscopy, flexible; with removal of                         tumor(s), polyp(s), or other lesion(s) by snare                         technique Diagnosis Code(s):     --- Professional ---                        Z86.010, Personal history of colonic polyps                        D12.2, Benign neoplasm of ascending colon                        K57.30, Diverticulosis of large intestine without                         perforation or abscess without bleeding CPT copyright 2022 American Medical Association. All rights reserved. The codes documented in this report are preliminary and upon coder review may  be revised to meet current compliance requirements. Andrey Farmer MD, MD 12/13/2022 9:19:47 AM Number of Addenda: 0 Note Initiated On: 12/13/2022 8:36 AM Scope Withdrawal Time: 0 hours 11 minutes 49 seconds   Total Procedure Duration: 0 hours 20 minutes 30 seconds  Estimated Blood Loss:  Estimated blood loss was minimal.      [facility]

## 2022-12-13 NOTE — Interval H&P Note (Signed)
History and Physical Interval Note:  12/13/2022 8:43 AM  Mike Ellis  has presented today for surgery, with the diagnosis of HX OF ADENOMAROUS POLYP OF COLON.  The various methods of treatment have been discussed with the patient and family. After consideration of risks, benefits and other options for treatment, the patient has consented to  Procedure(s): COLONOSCOPY WITH PROPOFOL (N/A) as a surgical intervention.  The patient's history has been reviewed, patient examined, no change in status, stable for surgery.  I have reviewed the patient's chart and labs.  Questions were answered to the patient's satisfaction.     Lesly Rubenstein  Ok to proceed with colonoscopy

## 2022-12-13 NOTE — Anesthesia Postprocedure Evaluation (Signed)
Anesthesia Post Note  Patient: Mike Ellis  Procedure(s) Performed: COLONOSCOPY WITH PROPOFOL  Patient location during evaluation: Endoscopy Anesthesia Type: General Level of consciousness: awake and alert Pain management: pain level controlled Vital Signs Assessment: post-procedure vital signs reviewed and stable Respiratory status: spontaneous breathing, nonlabored ventilation, respiratory function stable and patient connected to nasal cannula oxygen Cardiovascular status: blood pressure returned to baseline and stable Postop Assessment: no apparent nausea or vomiting Anesthetic complications: no   No notable events documented.   Last Vitals:  Vitals:   12/13/22 0926 12/13/22 0936  BP: 123/81 (!) 150/94  Pulse: 63 61  Resp: 19 15  Temp:    SpO2: 100% 100%    Last Pain:  Vitals:   12/13/22 0936  TempSrc:   PainSc: 0-No pain                 Precious Haws Aramis Zobel

## 2022-12-13 NOTE — Transfer of Care (Signed)
Immediate Anesthesia Transfer of Care Note  Patient: Mike Ellis  Procedure(s) Performed: COLONOSCOPY WITH PROPOFOL  Patient Location: Endoscopy Unit  Anesthesia Type:General  Level of Consciousness: drowsy  Airway & Oxygen Therapy: Patient Spontanous Breathing and Patient connected to nasal cannula oxygen  Post-op Assessment: Report given to RN, Post -op Vital signs reviewed and stable, and Patient moving all extremities  Post vital signs: Reviewed and stable  Last Vitals:  Vitals Value Taken Time  BP 99/58 12/13/22 0916  Temp 36.4 C 12/13/22 0916  Pulse 59 12/13/22 0917  Resp 17 12/13/22 0917  SpO2 98 % 12/13/22 0917  Vitals shown include unvalidated device data.  Last Pain:  Vitals:   12/13/22 0916  TempSrc: Temporal  PainSc: Asleep         Complications: No notable events documented.

## 2022-12-13 NOTE — H&P (Signed)
Outpatient short stay form Pre-procedure 12/13/2022  Lesly Rubenstein, MD  Primary Physician: Dion Body, MD  Reason for visit:  Surveillance  History of present illness:    72 y/o gentleman with history of HLD here for surveillance colonoscopy. Last colonoscopy in 2018 with adenomatous polyp. No blood thinners. No family history of GI malignancies. No significant abdominal surgeries.    Current Facility-Administered Medications:    0.9 %  sodium chloride infusion, , Intravenous, Continuous, Angellee Cohill, Hilton Cork, MD, Last Rate: 20 mL/hr at 12/13/22 0825, New Bag at 12/13/22 0825  Medications Prior to Admission  Medication Sig Dispense Refill Last Dose   pravastatin (PRAVACHOL) 20 MG tablet Take 1 tablet by mouth at bedtime.   12/12/2022   COVID-19 mRNA bivalent vaccine, Pfizer, (PFIZER COVID-19 VAC BIVALENT) injection Inject into the muscle. 0.3 mL 0    COVID-19 mRNA Vac-TriS, Pfizer, (PFIZER-BIONT COVID-19 VAC-TRIS) SUSP injection Inject into the muscle. 0.3 mL 0      No Known Allergies   Past Medical History:  Diagnosis Date   Actinic keratosis 09/18/2017   R nasal tip - bx proven    Squamous cell carcinoma of skin 06/02/2021   right mid lat forearm  EDC    Review of systems:  Otherwise negative.    Physical Exam  Gen: Alert, oriented. Appears stated age.  HEENT: PERRLA. Lungs: No respiratory distress CV: RRR Abd: soft, benign, no masses Ext: No edema    Planned procedures: Proceed with colonoscopy. The patient understands the nature of the planned procedure, indications, risks, alternatives and potential complications including but not limited to bleeding, infection, perforation, damage to internal organs and possible oversedation/side effects from anesthesia. The patient agrees and gives consent to proceed.  Please refer to procedure notes for findings, recommendations and patient disposition/instructions.     Lesly Rubenstein, MD [facility]  Gastroenterology

## 2022-12-14 ENCOUNTER — Encounter: Payer: Self-pay | Admitting: Gastroenterology

## 2022-12-14 LAB — SURGICAL PATHOLOGY

## 2023-06-22 ENCOUNTER — Ambulatory Visit: Payer: Medicare PPO | Admitting: Dermatology

## 2023-07-24 ENCOUNTER — Encounter: Payer: Self-pay | Admitting: Dermatology

## 2023-07-24 ENCOUNTER — Ambulatory Visit: Payer: Medicare PPO | Admitting: Dermatology

## 2023-07-24 VITALS — BP 148/85 | HR 76

## 2023-07-24 DIAGNOSIS — Z1283 Encounter for screening for malignant neoplasm of skin: Secondary | ICD-10-CM

## 2023-07-24 DIAGNOSIS — Z85828 Personal history of other malignant neoplasm of skin: Secondary | ICD-10-CM

## 2023-07-24 DIAGNOSIS — Z8589 Personal history of malignant neoplasm of other organs and systems: Secondary | ICD-10-CM

## 2023-07-24 DIAGNOSIS — L814 Other melanin hyperpigmentation: Secondary | ICD-10-CM | POA: Diagnosis not present

## 2023-07-24 DIAGNOSIS — W908XXA Exposure to other nonionizing radiation, initial encounter: Secondary | ICD-10-CM

## 2023-07-24 DIAGNOSIS — L820 Inflamed seborrheic keratosis: Secondary | ICD-10-CM

## 2023-07-24 DIAGNOSIS — L578 Other skin changes due to chronic exposure to nonionizing radiation: Secondary | ICD-10-CM

## 2023-07-24 DIAGNOSIS — D692 Other nonthrombocytopenic purpura: Secondary | ICD-10-CM

## 2023-07-24 DIAGNOSIS — L821 Other seborrheic keratosis: Secondary | ICD-10-CM

## 2023-07-24 DIAGNOSIS — D1801 Hemangioma of skin and subcutaneous tissue: Secondary | ICD-10-CM

## 2023-07-24 DIAGNOSIS — D229 Melanocytic nevi, unspecified: Secondary | ICD-10-CM

## 2023-07-24 DIAGNOSIS — Z872 Personal history of diseases of the skin and subcutaneous tissue: Secondary | ICD-10-CM

## 2023-07-24 DIAGNOSIS — L570 Actinic keratosis: Secondary | ICD-10-CM | POA: Diagnosis not present

## 2023-07-24 NOTE — Progress Notes (Signed)
Follow-Up Visit   Subjective  Mike Ellis is a 72 y.o. male who presents for the following: Skin Cancer Screening and Full Body Skin Exam. HxSCC, HxSK  The patient presents for Total-Body Skin Exam (TBSE) for skin cancer screening and mole check. The patient has spots, moles and lesions to be evaluated, some may be new or changing and the patient may have concern these could be cancer.    The following portions of the chart were reviewed this encounter and updated as appropriate: medications, allergies, medical history  Review of Systems:  No other skin or systemic complaints except as noted in HPI or Assessment and Plan.  Objective  Well appearing patient in no apparent distress; mood and affect are within normal limits.  A full examination was performed including scalp, head, eyes, ears, nose, lips, neck, chest, axillae, abdomen, back, buttocks, bilateral upper extremities, bilateral lower extremities, hands, feet, fingers, toes, fingernails, and toenails. All findings within normal limits unless otherwise noted below.   Relevant physical exam findings are noted in the Assessment and Plan.  Right Scalp above ear x1, left temple x1, right forearm x2, left post thigh x1, left medial thigh x1, left scalp x1 (7) Erythematous keratotic or waxy stuck-on papule or plaque.  Left Ear x1, left sideburn x1, right preauricular x1 (3) Erythematous thin papules/macules with gritty scale.     Assessment & Plan   HISTORY OF SQUAMOUS CELL CARCINOMA OF THE SKIN. Right mid lateral forearm. 06/02/2021. - No evidence of recurrence today - No lymphadenopathy - Recommend regular full body skin exams - Recommend daily broad spectrum sunscreen SPF 30+ to sun-exposed areas, reapply every 2 hours as needed.  - Call if any new or changing lesions are noted between office visits     SKIN CANCER SCREENING PERFORMED TODAY.  ACTINIC DAMAGE - Chronic condition, secondary to cumulative UV/sun  exposure - diffuse scaly erythematous macules with underlying dyspigmentation - Recommend daily broad spectrum sunscreen SPF 30+ to sun-exposed areas, reapply every 2 hours as needed.  - Staying in the shade or wearing long sleeves, sun glasses (UVA+UVB protection) and wide brim hats (4-inch brim around the entire circumference of the hat) are also recommended for sun protection.  - Call for new or changing lesions.  LENTIGINES, SEBORRHEIC KERATOSES, HEMANGIOMAS - Benign normal skin lesions - Benign-appearing - Call for any changes  MELANOCYTIC NEVI - Tan-brown and/or pink-flesh-colored symmetric macules and papules - Benign appearing on exam today - Observation - Call clinic for new or changing moles - Recommend daily use of broad spectrum spf 30+ sunscreen to sun-exposed areas.    Purpura - Chronic; persistent and recurrent.  Treatable, but not curable. - Violaceous macules and patches - Benign - Related to trauma, age, sun damage and/or use of blood thinners, chronic use of topical and/or oral steroids - Observe - Can use OTC arnica containing moisturizer such as Dermend Bruise Formula if desired - Call for worsening or other concerns    Inflamed seborrheic keratosis (7) Right Scalp above ear x1, left temple x1, right forearm x2, left post thigh x1, left medial thigh x1, left scalp x1  Symptomatic, irritating, patient would like treated.   RTC 6 weeks if not resolved.   Destruction of lesion - Right Scalp above ear x1, left temple x1, right forearm x2, left post thigh x1, left medial thigh x1, left scalp x1 (7) Complexity: simple   Destruction method: cryotherapy   Informed consent: discussed and consent obtained   Timeout:  patient name, date of birth, surgical site, and procedure verified Lesion destroyed using liquid nitrogen: Yes   Region frozen until ice ball extended beyond lesion: Yes   Outcome: patient tolerated procedure well with no complications    Post-procedure details: wound care instructions given    AK (actinic keratosis) (3) Left Ear x1, left sideburn x1, right preauricular x1  Actinic keratoses are precancerous spots that appear secondary to cumulative UV radiation exposure/sun exposure over time. They are chronic with expected duration over 1 year. A portion of actinic keratoses will progress to squamous cell carcinoma of the skin. It is not possible to reliably predict which spots will progress to skin cancer and so treatment is recommended to prevent development of skin cancer.  Recommend daily broad spectrum sunscreen SPF 30+ to sun-exposed areas, reapply every 2 hours as needed.  Recommend staying in the shade or wearing long sleeves, sun glasses (UVA+UVB protection) and wide brim hats (4-inch brim around the entire circumference of the hat). Call for new or changing lesions.  Destruction of lesion - Left Ear x1, left sideburn x1, right preauricular x1 (3) Complexity: simple   Destruction method: cryotherapy   Informed consent: discussed and consent obtained   Timeout:  patient name, date of birth, surgical site, and procedure verified Lesion destroyed using liquid nitrogen: Yes   Region frozen until ice ball extended beyond lesion: Yes   Outcome: patient tolerated procedure well with no complications   Post-procedure details: wound care instructions given   Additional details:  Prior to procedure, discussed risks of blister formation, small wound, skin dyspigmentation, or rare scar following cryotherapy. Recommend Vaseline ointment to treated areas while healing.    Return in about 1 year (around 07/23/2024) for TBSE, HxSCC.  I, Lawson Radar, CMA, am acting as scribe for Armida Sans, MD.   Documentation: I have reviewed the above documentation for accuracy and completeness, and I agree with the above.  Armida Sans, MD

## 2023-07-24 NOTE — Patient Instructions (Addendum)

## 2023-07-29 IMAGING — US US ABDOMINAL AORTA SCREENING AAA
1 series · 14 of 25 positions shown · non-contrast
Comparison: None Available.

CLINICAL DATA: Abdominal aortic aneurysm screening.

EXAM:
US ABDOMINAL AORTA MEDICARE SCREENING
TECHNIQUE: Ultrasound examination of the abdominal aorta was performed as a
screening evaluation for abdominal aortic aneurysm.

[Series 1: us abdominal aorta screening aaa · 0.22mm/px · 14 of 29 slices shown]
[im 1/29]
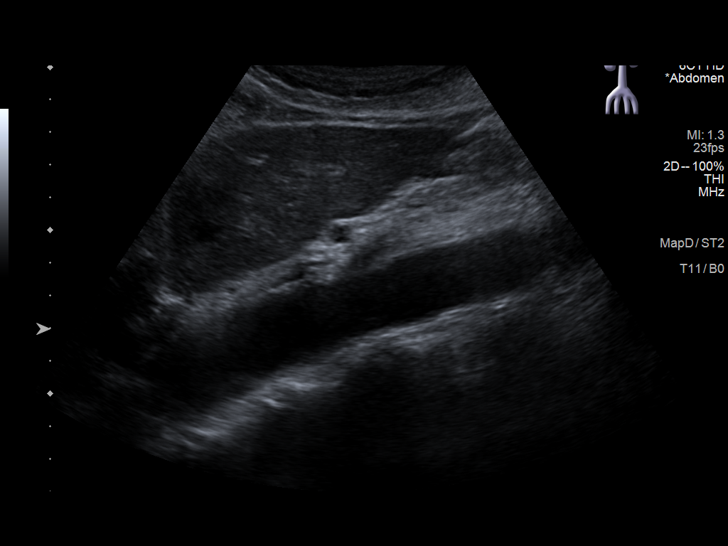
[im 3/29]
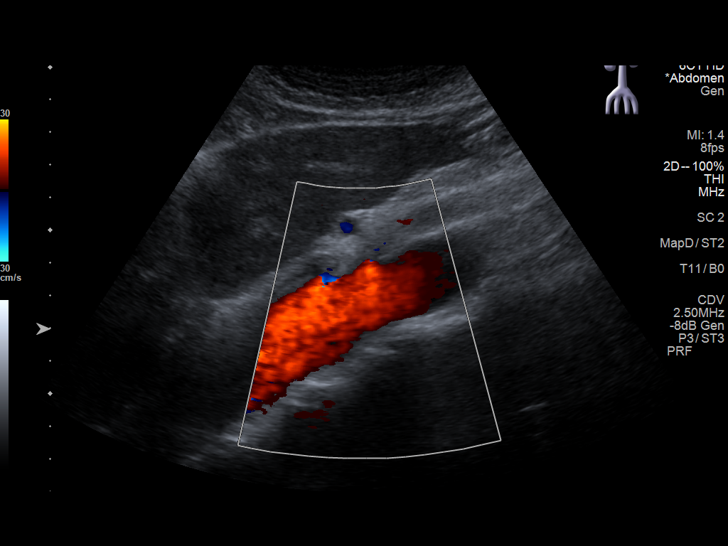
[im 5/29]
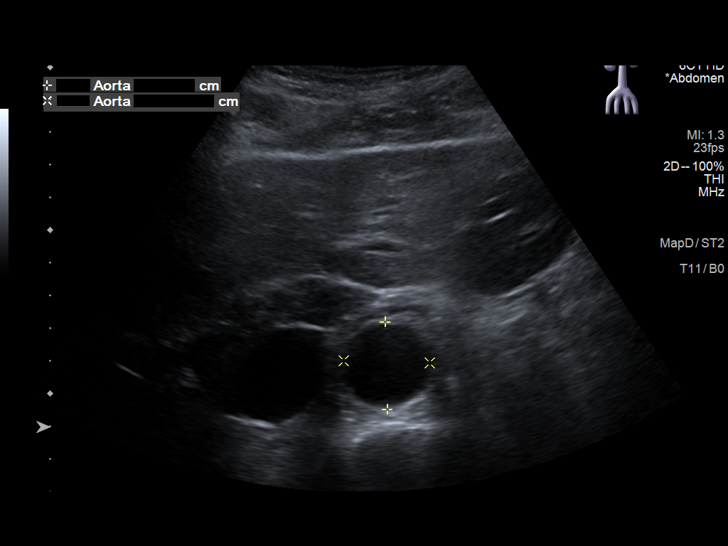
[im 8/29]
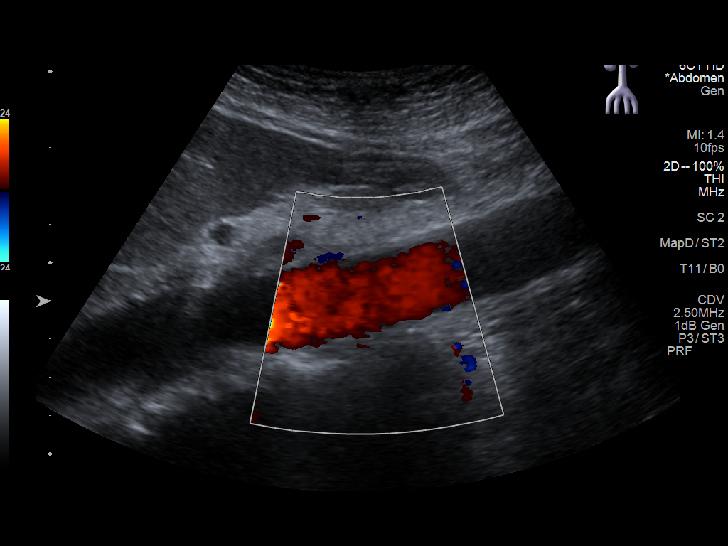
[im 10/29]
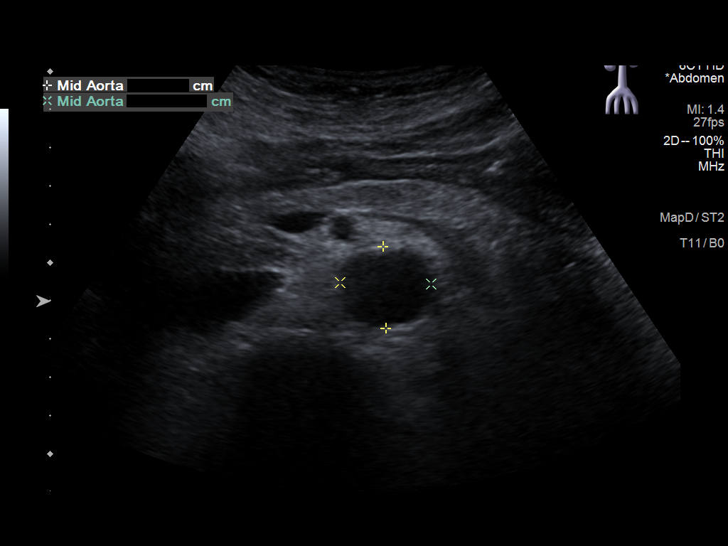
[im 11/29]
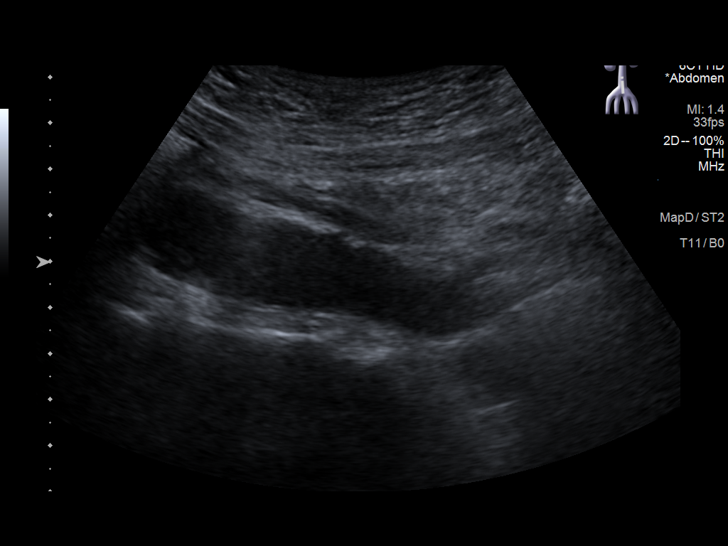
[im 13/29]
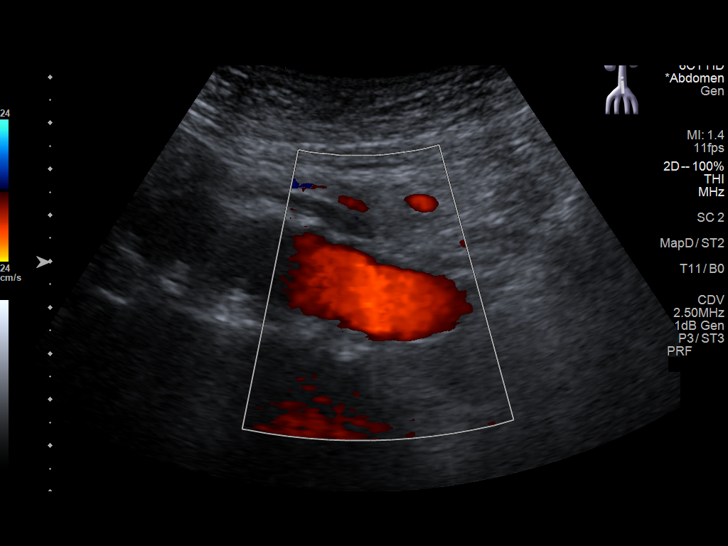
[im 16/29]
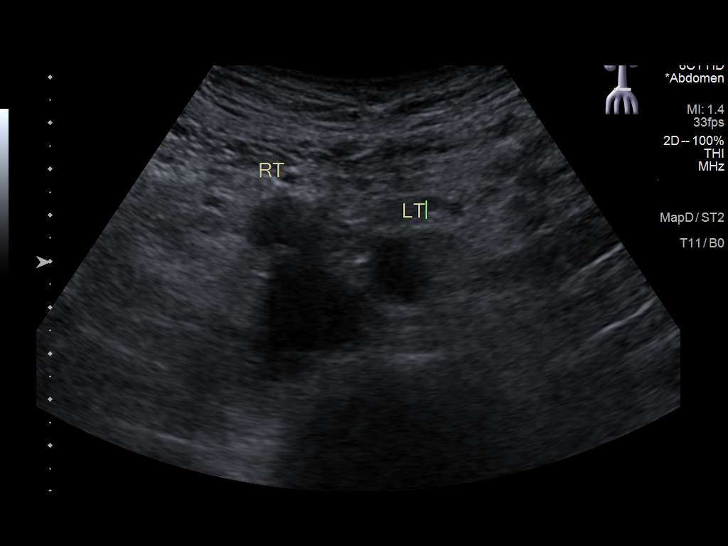
[im 18/29]
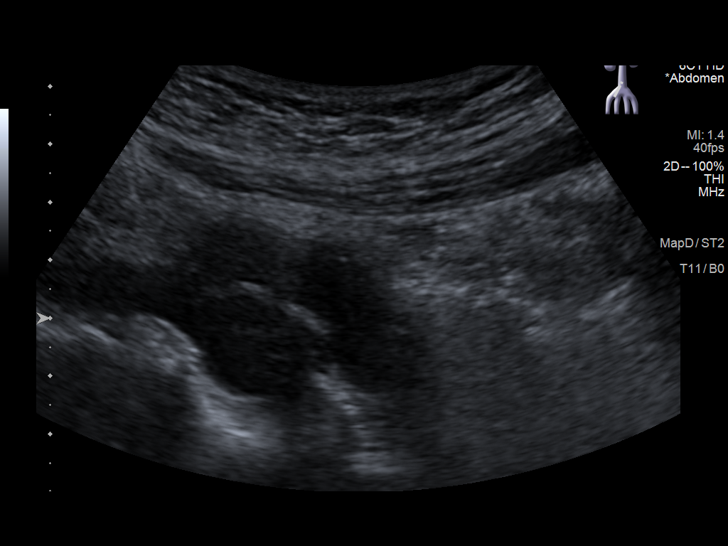
[im 19/29]
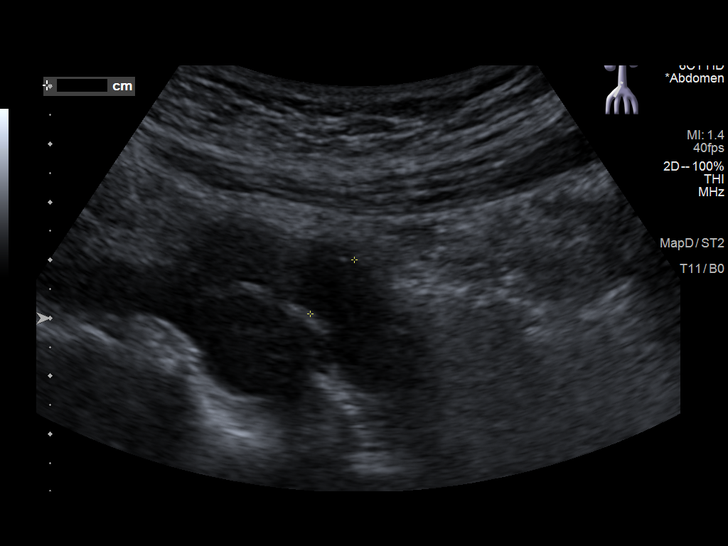
[im 22/29]
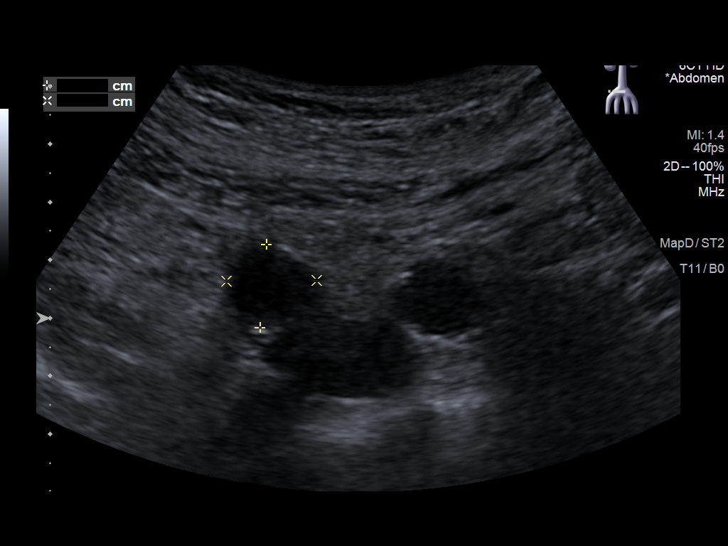
[im 24/29]
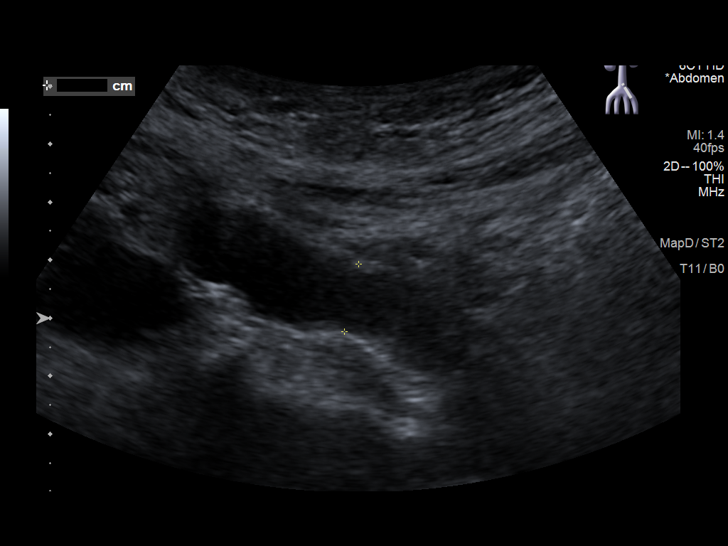
[im 26/29]
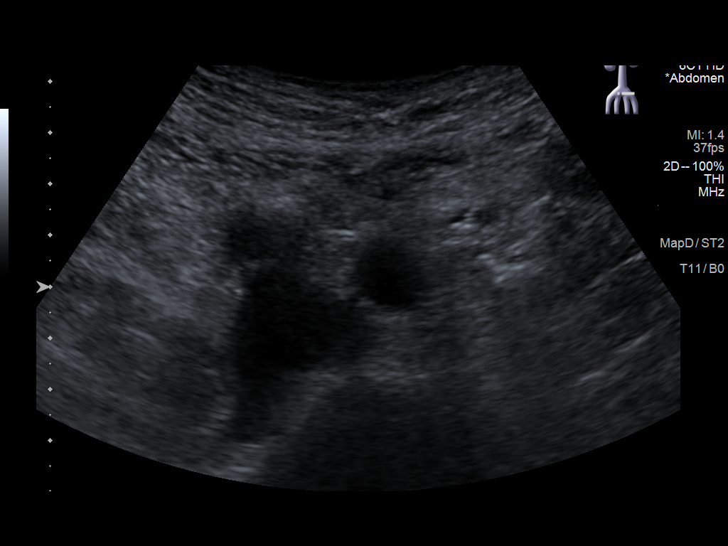
[im 29/29]
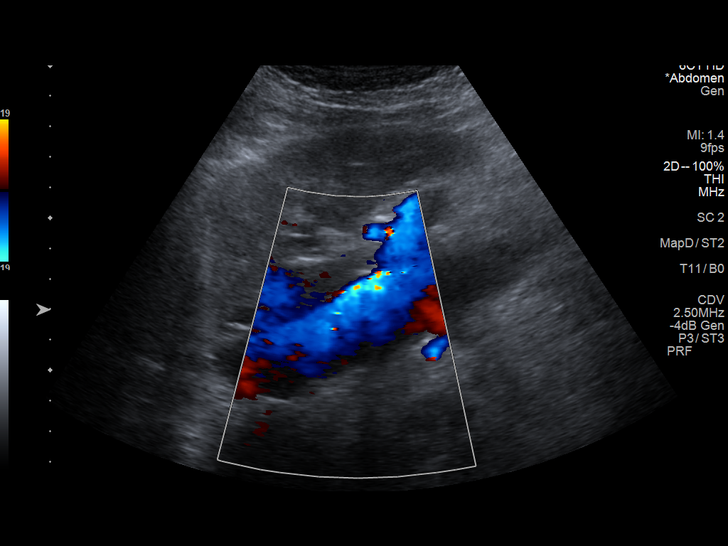

[14 of 25 positions shown; findings below may reference images not displayed]

FINDINGS: Abdominal aortic measurements as follows:

Proximal:  2.7 AP/2.6 transverse cm

Mid:  2.1 AP/2.4 transverse cm

Distal:  1.8 AP/1.8 transverse cm
IMPRESSION: No abdominal aortic aneurysm noted. No follow-up is recommended.
This recommendation follows ACR consensus guidelines: White Paper of
the ACR Incidental Findings Committee II on Vascular Findings. [HOSPITAL] 1004; [DATE].

## 2023-07-31 ENCOUNTER — Encounter: Payer: Self-pay | Admitting: Dermatology

## 2023-08-16 ENCOUNTER — Other Ambulatory Visit (HOSPITAL_COMMUNITY): Payer: Self-pay

## 2024-08-20 ENCOUNTER — Ambulatory Visit: Payer: Medicare PPO | Admitting: Dermatology

## 2024-08-20 ENCOUNTER — Encounter: Payer: Self-pay | Admitting: Dermatology

## 2024-08-20 DIAGNOSIS — L578 Other skin changes due to chronic exposure to nonionizing radiation: Secondary | ICD-10-CM

## 2024-08-20 DIAGNOSIS — W908XXA Exposure to other nonionizing radiation, initial encounter: Secondary | ICD-10-CM

## 2024-08-20 DIAGNOSIS — Z1283 Encounter for screening for malignant neoplasm of skin: Secondary | ICD-10-CM | POA: Diagnosis not present

## 2024-08-20 DIAGNOSIS — L814 Other melanin hyperpigmentation: Secondary | ICD-10-CM

## 2024-08-20 DIAGNOSIS — Z85828 Personal history of other malignant neoplasm of skin: Secondary | ICD-10-CM

## 2024-08-20 DIAGNOSIS — D229 Melanocytic nevi, unspecified: Secondary | ICD-10-CM

## 2024-08-20 DIAGNOSIS — L603 Nail dystrophy: Secondary | ICD-10-CM

## 2024-08-20 DIAGNOSIS — D1801 Hemangioma of skin and subcutaneous tissue: Secondary | ICD-10-CM

## 2024-08-20 DIAGNOSIS — M67449 Ganglion, unspecified hand: Secondary | ICD-10-CM

## 2024-08-20 DIAGNOSIS — Z8589 Personal history of malignant neoplasm of other organs and systems: Secondary | ICD-10-CM

## 2024-08-20 DIAGNOSIS — L570 Actinic keratosis: Secondary | ICD-10-CM

## 2024-08-20 DIAGNOSIS — L821 Other seborrheic keratosis: Secondary | ICD-10-CM

## 2024-08-20 DIAGNOSIS — Z7189 Other specified counseling: Secondary | ICD-10-CM

## 2024-08-20 DIAGNOSIS — L820 Inflamed seborrheic keratosis: Secondary | ICD-10-CM

## 2024-08-20 DIAGNOSIS — M67441 Ganglion, right hand: Secondary | ICD-10-CM

## 2024-08-20 NOTE — Progress Notes (Signed)
 Follow-Up Visit   Subjective  Mike Ellis is a 73 y.o. male who presents for the following: Skin Cancer Screening and Full Body Skin Exam hx of SCC, Aks, check R index finger, ~78m  The patient presents for Total-Body Skin Exam (TBSE) for skin cancer screening and mole check. The patient has spots, moles and lesions to be evaluated, some may be new or changing and the patient may have concern these could be cancer.  The following portions of the chart were reviewed this encounter and updated as appropriate: medications, allergies, medical history  Review of Systems:  No other skin or systemic complaints except as noted in HPI or Assessment and Plan.  Objective  Well appearing patient in no apparent distress; mood and affect are within normal limits.  A full examination was performed including scalp, head, eyes, ears, nose, lips, neck, chest, axillae, abdomen, back, buttocks, bilateral upper extremities, bilateral lower extremities, hands, feet, fingers, toes, fingernails, and toenails. All findings within normal limits unless otherwise noted below.   Relevant physical exam findings are noted in the Assessment and Plan.  face x 6 (6) Pink scaly macules face, neck x 16, R back x 1, R forearm x 1, R medial knee x 1, L knee x 1 (20) Stuck on waxy paps with erythema  Assessment & Plan   SKIN CANCER SCREENING PERFORMED TODAY.  ACTINIC DAMAGE - Chronic condition, secondary to cumulative UV/sun exposure - diffuse scaly erythematous macules with underlying dyspigmentation - Recommend daily broad spectrum sunscreen SPF 30+ to sun-exposed areas, reapply every 2 hours as needed.  - Staying in the shade or wearing long sleeves, sun glasses (UVA+UVB protection) and wide brim hats (4-inch brim around the entire circumference of the hat) are also recommended for sun protection.  - Call for new or changing lesions.  LENTIGINES, SEBORRHEIC KERATOSES, HEMANGIOMAS - Benign normal skin lesions -  Benign-appearing - Call for any changes  MELANOCYTIC NEVI - Tan-brown and/or pink-flesh-colored symmetric macules and papules - Benign appearing on exam today - Observation - Call clinic for new or changing moles - Recommend daily use of broad spectrum spf 30+ sunscreen to sun-exposed areas.   HISTORY OF SQUAMOUS CELL CARCINOMA OF THE SKIN - No evidence of recurrence today - No lymphadenopathy - Recommend regular full body skin exams - Recommend daily broad spectrum sunscreen SPF 30+ to sun-exposed areas, reapply every 2 hours as needed.  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  A digital mucous cyst also known as a myxoid cyst or pseudocyst is a ganglion cyst arising from the distal interphalangeal (DIP) joint of the finger or thumb (or, less commonly, toe). The cysts are believed to form from degeneration of connective tissue and are associated with osteoarthritic joints or injury. Although the exact etiology is unknown, it is likely that a small tear forms in a joint capsule or tendon sheath, allowing extravasation of synovial fluid into the adjacent tissue. When the fluid reacts with local tissue, it becomes more gelatinous and a cyst wall forms. With any treatment, there is a high rate of recurrence.   Treatment options include: - Puncture / Incision & Drainage (I&D) - Intralesional steroid injection - Intralesional Sclerosant injection (Asclera/ Polidocanol) - Intralesional steroid + sclerosant - Corticosteroid tape - Cryosurgery - Laser (CO2) - Infrared photocoagulation - Excision / Surgery  Treatment Plan: Benign,  observe Discussed referral to hand surgeon, pt declines  AK (ACTINIC KERATOSIS) (6) face x 6 (6) Actinic keratoses are precancerous spots that appear secondary to cumulative UV radiation exposure/sun exposure over  time. They are chronic with expected duration over 1 year. A portion of actinic keratoses will progress to squamous cell carcinoma of the skin. It is not possible to reliably predict which spots will progress to skin cancer and so treatment is recommended to prevent development of skin cancer.  Recommend daily broad spectrum sunscreen SPF 30+ to sun-exposed areas, reapply every 2 hours as needed.  Recommend staying in the shade or wearing long sleeves, sun glasses (UVA+UVB protection) and wide brim hats (4-inch brim around the entire circumference of the hat). Call for new or changing lesions. Destruction of lesion - face x 6 (6) Complexity: simple   Destruction method: cryotherapy   Informed consent: discussed and consent obtained   Timeout:  patient name, date of birth, surgical site, and procedure verified Lesion destroyed using liquid nitrogen: Yes   Region frozen until ice ball extended beyond lesion: Yes   Outcome: patient tolerated procedure well with no complications   Post-procedure details: wound care instructions given    INFLAMED SEBORRHEIC KERATOSIS (20) face, neck x 16, R back x 1, R forearm x 1, R medial knee x 1, L knee x 1 (20) Symptomatic, irritating, patient would like treated. Destruction of lesion - face, neck x 16, R back x 1, R forearm x 1, R medial knee x 1, L knee x 1 (20) Complexity: simple   Destruction method: cryotherapy   Informed consent: discussed and consent obtained   Timeout:  patient name, date of birth, surgical site, and procedure verified Lesion destroyed using liquid nitrogen: Yes   Region frozen until ice ball extended beyond lesion: Yes   Outcome: patient tolerated procedure well with no complications   Post-procedure details: wound care instructions given    ACTINIC SKIN DAMAGE   SKIN CANCER SCREENING   LENTIGO   MELANOCYTIC NEVUS, UNSPECIFIED LOCATION   HISTORY OF SQUAMOUS CELL CARCINOMA   DIGITAL MUCOUS CYST OF  FINGER    Return in about 1 year (around 08/20/2025) for TBSE, Hx of SCC, Hx of AKs.  I, Grayce Saunas, RMA, am acting as scribe for Alm Rhyme, MD .   Documentation: I have reviewed the above documentation for accuracy and completeness, and I agree with the above.  Alm Rhyme, MD

## 2024-08-20 NOTE — Patient Instructions (Addendum)

## 2025-08-27 ENCOUNTER — Ambulatory Visit: Admitting: Dermatology
